# Patient Record
Sex: Female | Born: 2005 | Race: Black or African American | Hispanic: No | Marital: Single | Smoking: Never smoker
Health system: Southern US, Community
[De-identification: ages and names within clinical notes are randomized; demographics above are authoritative.]

## PROBLEM LIST (undated history)

## (undated) DIAGNOSIS — J45909 Unspecified asthma, uncomplicated: Secondary | ICD-10-CM

---
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
## 2023-12-19 ENCOUNTER — Encounter: Payer: Self-pay | Admitting: Emergency Medicine

## 2023-12-19 ENCOUNTER — Ambulatory Visit: Admission: EM | Admit: 2023-12-19 | Discharge: 2023-12-19 | Disposition: A | Discharge status: Home / Self Care

## 2023-12-19 DIAGNOSIS — R112 Nausea with vomiting, unspecified: Secondary | ICD-10-CM

## 2023-12-19 HISTORY — DX: Unspecified asthma, uncomplicated: J45.909

## 2023-12-19 MED ORDER — ONDANSETRON 4 MG PO TBDP: 4.0000 mg | ORAL_TABLET | Freq: Three times a day (TID) | ORAL | 0 refills | Status: AC | PRN

## 2023-12-19 NOTE — ED Triage Notes (Signed)
 Patient complains of nausea, emesis, chills, dizziness and abdominal pain x 2 days after eating  food from [employer] Patient denies current pain. Has not taken anything for symptoms.

## 2023-12-19 NOTE — ED Provider Notes (Signed)
 Joy Santos    CSN: [id number] Arrival date & time: 12/19/23  0816      History   Chief Complaint Chief Complaint  Patient presents with   Abdominal Pain   Dizziness   Emesis   Chills    HPI Joy Santos is a 18 y.o. female.  Patient presents with 2-day history of nausea, vomiting, abdominal pain, chills, dizziness.  No vomiting today; 2 episodes of emesis yesterday and 2 episodes the day before.  Patient does not currently have abdominal pain; she reports this only occurs when she vomits.  She denies fever, diarrhea, constipation, hemoptysis, blood in stool, dysuria, hematuria, chest pain, shortness of breath.  Last bowel movement yesterday and was "normal."  No OTC medications taken.  She denies current pregnancy or breastfeeding.     The history is provided by the patient and medical records.    Past Medical History:  Diagnosis Date   Asthma     There are no active problems to display for this patient.   History reviewed. No pertinent surgical history.  OB History   No obstetric history on file.      Home Medications    Prior to Admission medications   Medication Sig Start Date End Date Taking? Authorizing Provider  albuterol (VENTOLIN HFA) 108 (90 Base) MCG/ACT inhaler Inhale 2 puffs into the lungs every 6 (six) hours as needed for wheezing or shortness of breath.   Yes [provider]  ondansetron (ZOFRAN-ODT) 4 MG disintegrating tablet Take 1 tablet (4 mg total) by mouth every 8 (eight) hours as needed for nausea or vomiting. 12/19/23  Yes Wellington Half, NP  ibuprofen  (ADVIL ,MOTRIN ) 400 MG tablet Take 1 tablet (400 mg total) by mouth every 6 (six) hours as needed. 10/20/16   Triplett, Davene Ernst B, FNP  naproxen (NAPROSYN) 500 MG tablet TK 1 T PO BID WC 04/05/18   [provider]  SF 5000 PLUS 1.1 % CREA dental cream BRUSH TWICE DAILY INSTEAD OF REGULAR TOOTHPASTE. DO NOT EAT OR DRINK FOR 30 MINUTES AFTER BRUSHING 02/05/18   [provider]    Family History Family History  Problem Relation Age of Onset   Healthy Mother    Healthy Father     Social History Social History   Tobacco Use   Smoking status: Never   Smokeless tobacco: Never  Vaping Use   Vaping status: Never Used  Substance Use Topics   Alcohol use: No   Drug use: Never     Allergies   Shellfish allergy, Peanut butter flavoring agent (non-screening), and Tomato   Review of Systems Review of Systems  Constitutional:  Positive for chills. Negative for fever.  Respiratory:  Negative for cough and shortness of breath.   Cardiovascular:  Negative for chest pain and palpitations.  Gastrointestinal:  Positive for abdominal pain, nausea and vomiting. Negative for blood in stool, constipation and diarrhea.  Genitourinary:  Negative for dysuria and hematuria.  Neurological:  Negative for syncope, weakness, numbness and headaches.     Physical Exam Triage Vital Signs ED Triage Vitals  Encounter Vitals Group     BP 12/19/23 0852 115/78     Systolic BP Percentile --      Diastolic BP Percentile --      Pulse Rate 12/19/23 0852 72     Resp 12/19/23 0852 18     Temp 12/19/23 0852 98.3 F (36.8 C)     Temp Source 12/19/23 0852 Oral  SpO2 12/19/23 0852 98 %     Weight --      Height --      Head Circumference --      Peak Flow --      Pain Score 12/19/23 0845 0     Pain Loc --      Pain Education --      Exclude from Growth Chart --    No data found.  Updated Vital Signs BP 115/78 (BP Location: Left Arm)   Pulse 72   Temp 98.3 F (36.8 C) (Oral)   Resp 18   LMP 12/17/2023   SpO2 98%   Visual Acuity Right Eye Distance:   Left Eye Distance:   Bilateral Distance:    Right Eye Near:   Left Eye Near:    Bilateral Near:     Physical Exam Constitutional:      General: She is not in acute distress. HENT:     Mouth/Throat:     Mouth: Mucous membranes are moist.  Cardiovascular:     Rate and Rhythm: Normal rate  and regular rhythm.  Pulmonary:     Effort: Pulmonary effort is normal. No respiratory distress.  Abdominal:     General: Bowel sounds are normal.     Palpations: Abdomen is soft.     Tenderness: There is no abdominal tenderness. There is no guarding or rebound.  Skin:    General: Skin is warm and dry.  Neurological:     General: No focal deficit present.     Mental Status: She is alert.     Sensory: No sensory deficit.     Motor: No weakness.     Gait: Gait normal.      UC Treatments / Results  Labs (all labs ordered are listed, but only abnormal results are displayed) Labs Reviewed - No data to display  EKG   Radiology No results found.  Procedures Procedures (including critical care time)  Medications Ordered in UC Medications - No data to display  Initial Impression / Assessment and Plan / UC Course  I have reviewed the triage vital signs and the nursing notes.  Pertinent labs & imaging results that were available during my care of the patient were reviewed by me and considered in my medical decision making (see chart for details).    Nausea and vomiting.  Afebrile and vital signs are stable.  Work note provided per patient request.  No emesis today.  No abdominal pain today.  Treating with Zofran.  Discussed clear liquid diet.  Instructed patient to advance diet as tolerated.  Discussed maintaining oral hydration at home; ED precautions discussed.  Education provided on nausea and vomiting.  Instructed patient to follow-up with her PCP if she is not improving.  She agrees to plan of care.   Final Clinical Impressions(s) / UC Diagnoses   Final diagnoses:  Nausea and vomiting, unspecified vomiting type     Discharge Instructions      Take the antinausea medication as directed.    Keep yourself hydrated with clear liquids, such as water.     Go to the emergency department if you have worsening symptoms.    Follow up with your primary care provider.      ED Prescriptions     Medication Sig Dispense Auth. Provider   ondansetron (ZOFRAN-ODT) 4 MG disintegrating tablet Take 1 tablet (4 mg total) by mouth every 8 (eight) hours as needed for nausea or vomiting. 20 tablet Leanor Proper  H, NP      PDMP not reviewed this encounter.   Wellington Half, NP 12/19/23 [phone]

## 2023-12-19 NOTE — Discharge Instructions (Addendum)
Take the antinausea medication as directed.    Keep yourself hydrated with clear liquids, such as water.    Go to the emergency department if you have worsening symptoms.    Follow up with your primary care provider.

## 2024-06-19 ENCOUNTER — Encounter: Payer: Self-pay | Admitting: Emergency Medicine

## 2024-06-19 ENCOUNTER — Ambulatory Visit
Admission: EM | Admit: 2024-06-19 | Discharge: 2024-06-19 | Disposition: A | Discharge status: Home / Self Care | Attending: Emergency Medicine | Admitting: Emergency Medicine

## 2024-06-19 DIAGNOSIS — J069 Acute upper respiratory infection, unspecified: Secondary | ICD-10-CM | POA: Diagnosis not present

## 2024-06-19 MED ORDER — BENZONATATE 100 MG PO CAPS: 100.0000 mg | ORAL_CAPSULE | Freq: Three times a day (TID) | ORAL | 0 refills | Status: AC

## 2024-06-19 MED ORDER — AMOXICILLIN-POT CLAVULANATE 875-125 MG PO TABS: 1.0000 | ORAL_TABLET | Freq: Two times a day (BID) | ORAL | 0 refills | Status: AC

## 2024-06-19 MED ORDER — PROMETHAZINE-DM 6.25-15 MG/5ML PO SYRP: 5.0000 mL | ORAL_SOLUTION | Freq: Every evening | ORAL | 0 refills | Status: AC | PRN

## 2024-06-19 NOTE — ED Provider Notes (Signed)
 [facility]    CSN: [id number] Arrival date & time: 06/19/24  0900      History   Chief Complaint Chief Complaint  Patient presents with   Emesis   Cough   Nasal Congestion   Headache    HPI Allani Jr is a 18 y.o. female.   Patient presents for evaluation of nasal congestion, sore throat, primarily nonproductive cough and intermittent headaches present for 7 days.  Had 1 occurrence of vomiting today which has resolved, tolerable to food and liquids.  Known sick contacts at work.  Has attempted use of Hall's cough drops.    Past Medical History:  Diagnosis Date   Asthma     There are no active problems to display for this patient.   History reviewed. No pertinent surgical history.  OB History   No obstetric history on file.      Home Medications    Prior to Admission medications   Medication Sig Start Date End Date Taking? Authorizing Provider  albuterol (VENTOLIN HFA) 108 (90 Base) MCG/ACT inhaler Inhale 2 puffs into the lungs every 6 (six) hours as needed for wheezing or shortness of breath.    [provider]  ibuprofen  (ADVIL ,MOTRIN ) 400 MG tablet Take 1 tablet (400 mg total) by mouth every 6 (six) hours as needed. 10/20/16   Triplett, Kirk B, FNP  naproxen (NAPROSYN) 500 MG tablet TK 1 T PO BID WC 04/05/18   [provider]  ondansetron  (ZOFRAN -ODT) 4 MG disintegrating tablet Take 1 tablet (4 mg total) by mouth every 8 (eight) hours as needed for nausea or vomiting. 12/19/23   Corlis Burnard DEL, NP  SF 5000 PLUS 1.1 % CREA dental cream BRUSH TWICE DAILY INSTEAD OF REGULAR TOOTHPASTE. DO NOT EAT OR DRINK FOR 30 MINUTES AFTER BRUSHING 02/05/18   [provider]    Family History Family History  Problem Relation Age of Onset   Healthy Mother    Healthy Father     Social History Social History   Tobacco Use   Smoking status: Never   Smokeless tobacco: Never  Vaping Use   Vaping status: Never Used  Substance Use Topics    Alcohol use: No   Drug use: Never     Allergies   Shellfish allergy, Peanut butter flavoring agent (non-screening), and Tomato   Review of Systems Review of Systems  Constitutional: Negative.   HENT:  Positive for congestion and sore throat. Negative for dental problem, drooling, ear discharge, ear pain, facial swelling, hearing loss, mouth sores, nosebleeds, postnasal drip, rhinorrhea, sinus pressure, sinus pain, sneezing, tinnitus, trouble swallowing and voice change.   Respiratory:  Positive for cough. Negative for apnea, choking, chest tightness, shortness of breath, wheezing and stridor.   Gastrointestinal:  Positive for vomiting. Negative for abdominal distention, abdominal pain, anal bleeding, blood in stool, constipation, diarrhea, nausea and rectal pain.     Physical Exam Triage Vital Signs ED Triage Vitals  Encounter Vitals Group     BP 06/19/24 0956 107/71     Girls Systolic BP Percentile --      Girls Diastolic BP Percentile --      Boys Systolic BP Percentile --      Boys Diastolic BP Percentile --      Pulse Rate 06/19/24 0956 80     Resp 06/19/24 0956 18     Temp 06/19/24 0956 98.3 F (36.8 C)     Temp Source 06/19/24 0956 Oral     SpO2  06/19/24 0956 97 %     Weight --      Height --      Head Circumference --      Peak Flow --      Pain Score 06/19/24 0959 6     Pain Loc --      Pain Education --      Exclude from Growth Chart --    No data found.  Updated Vital Signs BP 107/71 (BP Location: Left Arm)   Pulse 80   Temp 98.3 F (36.8 C) (Oral)   Resp 18   LMP 05/28/2024 (Exact Date)   SpO2 97%   Visual Acuity Right Eye Distance:   Left Eye Distance:   Bilateral Distance:    Right Eye Near:   Left Eye Near:    Bilateral Near:     Physical Exam Constitutional:      Appearance: Normal appearance.  HENT:     Right Ear: Tympanic membrane, ear canal and external ear normal.     Left Ear: Tympanic membrane, ear canal and external ear normal.      Nose: Congestion present.     Mouth/Throat:     Pharynx: No oropharyngeal exudate or posterior oropharyngeal erythema.  Eyes:     Extraocular Movements: Extraocular movements intact.  Cardiovascular:     Rate and Rhythm: Normal rate and regular rhythm.     Pulses: Normal pulses.     Heart sounds: Normal heart sounds.  Pulmonary:     Effort: Pulmonary effort is normal.     Breath sounds: Normal breath sounds.  Neurological:     Mental Status: She is alert and oriented to person, place, and time. Mental status is at baseline.      UC Treatments / Results  Labs (all labs ordered are listed, but only abnormal results are displayed) Labs Reviewed - No data to display  EKG   Radiology No results found.  Procedures Procedures (including critical care time)  Medications Ordered in UC Medications - No data to display  Initial Impression / Assessment and Plan / UC Course  I have reviewed the triage vital signs and the nursing notes.  Pertinent labs & imaging results that were available during my care of the patient were reviewed by me and considered in my medical decision making (see chart for details).  Acute URI  Patient is in no signs of distress nor toxic appearing.  Vital signs are stable.  Low suspicion for pneumonia, pneumothorax or bronchitis and therefore will defer imaging.  Viral testing deferred due to timeline.  Prescribed Augmentin  Tessalon  Promethazine  DM.May use additional over-the-counter medications as needed for supportive care.  May follow-up with urgent care as needed if symptoms persist or worsen.   Final Clinical Impressions(s) / UC Diagnoses   Final diagnoses:  None   Discharge Instructions   None    ED Prescriptions   None    PDMP not reviewed this encounter.   Teresa Shelba SAUNDERS, NP 06/19/24 1018

## 2024-06-19 NOTE — Discharge Instructions (Addendum)
 Begin Augmentin  twice daily for 7 days for treatment of your symptoms  You may use Tessalon  pill every 8 hours as needed for cough and may use cough syrup at bedtime to help you rest  You can take Tylenol and/or Ibuprofen  as needed for fever reduction and pain relief.   For cough: honey 1/2 to 1 teaspoon (you can dilute the honey in water or another fluid).  You can also use guaifenesin and dextromethorphan for cough. You can use a humidifier for chest congestion and cough.  If you don't have a humidifier, you can sit in the bathroom with the hot shower running.      For sore throat: try warm salt water gargles, cepacol lozenges, throat spray, warm tea or water with lemon/honey, popsicles or ice, or OTC cold relief medicine for throat discomfort.   For congestion: take a daily anti-histamine like Zyrtec, Claritin, and a oral decongestant, such as pseudoephedrine.  You can also use Flonase 1-2 sprays in each nostril daily.   It is important to stay hydrated: drink plenty of fluids (water, gatorade/powerade/pedialyte, juices, or teas) to keep your throat moisturized and help further relieve irritation/discomfort.

## 2024-06-19 NOTE — ED Triage Notes (Signed)
 Patient reports headache, runny nose and cough x 1 week. Patient reports vomiting that started today. Patient only had one episodes vomiting today. Rates pain 6/10. Patient had taking anything for symptoms.
